# Patient Record
Sex: Male | Born: 2000 | Race: White | Hispanic: No | Marital: Single | Smoking: Current every day smoker
Health system: Southern US, Community
[De-identification: ages and names within clinical notes are randomized; demographics above are authoritative.]

## PROBLEM LIST (undated history)

## (undated) DIAGNOSIS — S069XAA Unspecified intracranial injury with loss of consciousness status unknown, initial encounter: Secondary | ICD-10-CM

## (undated) DIAGNOSIS — Z789 Other specified health status: Secondary | ICD-10-CM

## (undated) DIAGNOSIS — K5900 Constipation, unspecified: Secondary | ICD-10-CM

## (undated) DIAGNOSIS — S069X9A Unspecified intracranial injury with loss of consciousness of unspecified duration, initial encounter: Secondary | ICD-10-CM

## (undated) HISTORY — PX: HERNIA REPAIR: SHX51

## (undated) HISTORY — PX: CIRCUMCISION: SUR203

---
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
## 2022-11-20 ENCOUNTER — Emergency Department (HOSPITAL_BASED_OUTPATIENT_CLINIC_OR_DEPARTMENT_OTHER)
Admission: EM | Admit: 2022-11-20 | Discharge: 2022-11-20 | Disposition: A | Discharge status: Home / Self Care | Payer: Self-pay | Attending: Emergency Medicine | Admitting: Emergency Medicine

## 2022-11-20 ENCOUNTER — Encounter (HOSPITAL_BASED_OUTPATIENT_CLINIC_OR_DEPARTMENT_OTHER): Payer: Self-pay | Admitting: Emergency Medicine

## 2022-11-20 ENCOUNTER — Other Ambulatory Visit (HOSPITAL_BASED_OUTPATIENT_CLINIC_OR_DEPARTMENT_OTHER): Payer: Self-pay

## 2022-11-20 ENCOUNTER — Emergency Department (HOSPITAL_BASED_OUTPATIENT_CLINIC_OR_DEPARTMENT_OTHER): Payer: Self-pay

## 2022-11-20 DIAGNOSIS — R0981 Nasal congestion: Secondary | ICD-10-CM | POA: Insufficient documentation

## 2022-11-20 DIAGNOSIS — Z1152 Encounter for screening for COVID-19: Secondary | ICD-10-CM | POA: Insufficient documentation

## 2022-11-20 DIAGNOSIS — F17210 Nicotine dependence, cigarettes, uncomplicated: Secondary | ICD-10-CM | POA: Insufficient documentation

## 2022-11-20 DIAGNOSIS — B974 Respiratory syncytial virus as the cause of diseases classified elsewhere: Secondary | ICD-10-CM | POA: Insufficient documentation

## 2022-11-20 DIAGNOSIS — B338 Other specified viral diseases: Secondary | ICD-10-CM

## 2022-11-20 LAB — RESP PANEL BY RT-PCR (RSV, FLU A&B, COVID)  RVPGX2
Influenza A by PCR: NEGATIVE
Influenza B by PCR: NEGATIVE
Resp Syncytial Virus by PCR: POSITIVE — AB
SARS Coronavirus 2 by RT PCR: NEGATIVE

## 2022-11-20 MED ORDER — ONDANSETRON 4 MG PO TBDP
4.0000 mg | ORAL_TABLET | Freq: Once | ORAL | Status: AC
  Administered 2022-11-20: 4 mg via ORAL
  Filled 2022-11-20: qty 1

## 2022-11-20 MED ORDER — BENZONATATE 100 MG PO CAPS
100.0000 mg | ORAL_CAPSULE | Freq: Three times a day (TID) | ORAL | 0 refills | Status: DC | PRN
  Filled 2022-11-20: qty 21, 7d supply, fill #0

## 2022-11-20 MED ORDER — ONDANSETRON HCL 4 MG PO TABS
4.0000 mg | ORAL_TABLET | Freq: Four times a day (QID) | ORAL | 0 refills | Status: DC | PRN
  Filled 2022-11-20: qty 12, 3d supply, fill #0

## 2022-11-20 NOTE — ED Notes (Signed)
Pt discharged to home. Discharge instructions have been discussed with patient and/or family members. Pt verbally acknowledges understanding d/c instructions, and endorses comprehension to checkout at registration before leaving. Pt endorses inability to wait to obtain v/s

## 2022-11-20 NOTE — ED Triage Notes (Signed)
Pt is c/o his lungs feel like they are burning when he breathes esp his right one, cough, lots of mucous, and difficulty sleeping   Sxs started about 2 weeks ago

## 2022-11-20 NOTE — ED Notes (Signed)
Per EDP order, pt given fluids and/or food for PO challenge. Pt verbalized understanding to utilize call bell if nausea or emesis occur. 

## 2022-11-20 NOTE — Discharge Instructions (Addendum)
Note the workup today was overall consistent with RSV infection.  As discussed, recommend taking Zofran as needed for nausea/vomiting.  Recommend daily allergy medicine in the form of Zyrtec/Allegra/Claritin.  Also recommend nasal steroid spray in the form of Nasacort/Flonase.  You can take over-the-counter decongestion such as Sudafed.  Also prescribed a cough suppressant called benzonatate to take as needed for persistent cough.  Recommend reevaluation by primary care in 5 to 7 days for reassessment of your symptoms.  Please do not hesitate to return to emergency department for worrisome signs and symptoms we discussed become apparent.

## 2022-11-20 NOTE — ED Provider Notes (Signed)
[facility] EMERGENCY DEPARTMENT Provider Note   CSN: [id number] Arrival date & time: 11/20/22  0654     History  Chief Complaint  Patient presents with   Illness    Edward [name] is a 22 y.o. male.   Illness   22 year old male presents emergency department with complaints of diffuse body aches/joint pain, cough, nasal congestion, sore throat, nausea, vomiting.  Patient reports symptoms intermittent over the past week with worsening over the past 1 to 2 days.  Patient with known sick contact in the form of girlfriend who lives with him exhibiting the same symptoms currently.  Has tried at home DayQuil/NyQuil as well as Mucinex which has helped some.  Denies fever, chills, night sweats, visual disturbance, gait abnormality, weakness/sensory deficits in upper or lower extremities, slurred speech, facial droop, chest pain, shortness of breath, abdominal pain, hematemesis, urinary symptoms, change in bowel habits.  Past medical history significant for TBI  Home Medications Prior to Admission medications   Medication Sig Start Date End Date Taking? Authorizing Provider  benzonatate (TESSALON) 100 MG capsule Take 1 capsule (100 mg total) by mouth 3 (three) times daily as needed for cough. 11/20/22  Yes Dion Saucier A, PA  ondansetron (ZOFRAN) 4 MG tablet Take 1 tablet (4 mg total) by mouth every 6 (six) hours as needed for nausea or vomiting. 11/20/22  Yes Dion Saucier A, PA  diphenhydrAMINE (BENADRYL) 25 MG tablet Take 25 mg as needed by mouth for itching (spider bite).    [provider]  polyethylene glycol powder (GLYCOLAX/MIRALAX) powder Take 255 g once by mouth.  08/31/17   [provider]      Allergies    Patient has no known allergies.    Review of Systems   Review of Systems  All other systems reviewed and are negative.   Physical Exam Updated Vital Signs BP 120/85 (BP Location: Left Arm)   Pulse 84   Temp 98.3 F (36.8 C)   Resp 16    Ht 5\' 11"  (1.803 m)   Wt 75.3 kg   SpO2 99%   BMI 23.15 kg/m  Physical Exam Vitals and nursing note reviewed.  Constitutional:      General: He is not in acute distress.    Appearance: He is well-developed.  HENT:     Head: Normocephalic and atraumatic.     Right Ear: Tympanic membrane normal.     Left Ear: Tympanic membrane normal.     Nose: Congestion and rhinorrhea present.     Mouth/Throat:     Pharynx: Posterior oropharyngeal erythema present.     Comments: Patient with mild posterior pharyngeal erythema.  Uvula midline rise symmetrical with phonation.  Tonsils are 1+ bilaterally with no obvious exudate.  No sublingual or submandibular swelling appreciated. Eyes:     Conjunctiva/sclera: Conjunctivae normal.  Cardiovascular:     Rate and Rhythm: Normal rate and regular rhythm.     Heart sounds: No murmur heard. Pulmonary:     Effort: Pulmonary effort is normal. No respiratory distress.     Breath sounds: Normal breath sounds. No stridor. No wheezing, rhonchi or rales.  Abdominal:     Palpations: Abdomen is soft.     Tenderness: There is no abdominal tenderness.  Musculoskeletal:        General: No swelling.     Cervical back: Neck supple. No rigidity or tenderness.  Skin:    General: Skin is warm and dry.     Capillary Refill: Capillary  refill takes less than 2 seconds.  Neurological:     Mental Status: He is alert.  Psychiatric:        Mood and Affect: Mood normal.     ED Results / Procedures / Treatments   Labs (all labs ordered are listed, but only abnormal results are displayed) Labs Reviewed  RESP PANEL BY RT-PCR (RSV, FLU A&B, COVID)  RVPGX2 - Abnormal; Notable for the following components:      Result Value   Resp Syncytial Virus by PCR POSITIVE (*)    All other components within normal limits    EKG None  Radiology DG Chest 2 View  Result Date: 11/20/2022 CLINICAL DATA:  Chest burning with cough and difficulty sleeping. EXAM: CHEST - 2 VIEW  COMPARISON:  Radiographs 06/20/2013.  CT 06/19/2013. FINDINGS: The heart size and mediastinal contours are normal. The lungs are clear. There is no pleural effusion or pneumothorax. No acute osseous findings are identified. IMPRESSION: No active cardiopulmonary process. Electronically Signed   By: Carey Bullocks M.D.   On: 11/20/2022 08:00    Procedures Procedures    Medications Ordered in ED Medications  ondansetron (ZOFRAN-ODT) disintegrating tablet 4 mg (4 mg Oral Given 11/20/22 0942)    ED Course/ Medical Decision Making/ A&P Clinical Course as of 11/20/22 0958  Thu Nov 20, 2022  0916 Shared decision-making conversation was had regarding patient's intermittent emesis over the past week.  Patient elected for Zofran with trial of food/liquids by mouth and declined laboratory studies and IV fluids at this time. [CR]    Clinical Course User Index [CR] Peter Garter, PA                             Medical Decision Making Amount and/or Complexity of Data Reviewed Radiology: ordered.  Risk Prescription drug management.   This patient presents to the ED for concern of flulike illness, this involves an extensive number of treatment options, and is a complaint that carries with it a high risk of complications and morbidity.  The differential diagnosis includes influenza, COVID, RSV, pneumonia, sepsis, meningitis   Co morbidities that complicate the patient evaluation  See HPI   Additional history obtained:  Additional history obtained from EMR External records from outside source obtained and reviewed including hospital records   Lab Tests:  I Ordered, and personally interpreted labs.  The pertinent results include: Respiratory viral panel positive for RSV   Imaging Studies ordered:  I ordered imaging studies including chest x-ray I independently visualized and interpreted imaging which showed no cardiopulmonary abnormalities I agree with the radiologist  interpretation   Cardiac Monitoring: / EKG:  The patient was maintained on a cardiac monitor.  I personally viewed and interpreted the cardiac monitored which showed an underlying rhythm of: Sinus rhythm   Consultations Obtained:  N/a   Problem List / ED Course / Critical interventions / Medication management  RSV I ordered medication including Zofran   Reevaluation of the patient after these medicines showed that the patient improved I have reviewed the patients home medicines and have made adjustments as needed   Social Determinants of Health:  Chronic cigarette use.  Denies illicit drug use.   Test / Admission - Considered:  RSV Vitals signs  within normal range and stable throughout visit. Laboratory/imaging studies significant for: See above Patient symptoms most likely secondary to RSV.  Patient given Zofran while in the emergency department and tolerated p.o.  without difficulty.  Recommend symptomatic therapy at home with daily antihistamine, cough suppressant as needed, Zofran as needed for nausea/vomiting, nasal steroid spray, Cepacol throat lozenge as well as decongestion.  Recommend follow-up with primary care for reassessment of symptoms.  Treatment plan discussed at length with patient and the patient acknowledge understanding of said plan. Worrisome signs and symptoms were discussed with the patient, and the patient acknowledged understanding to return to the ED if noticed. Patient was stable upon discharge.          Final Clinical Impression(s) / ED Diagnoses Final diagnoses:  RSV (respiratory syncytial virus infection)    Rx / DC Orders ED Discharge Orders          Ordered    ondansetron (ZOFRAN) 4 MG tablet  Every 6 hours PRN        11/20/22 0953    benzonatate (TESSALON) 100 MG capsule  3 times daily PRN        11/20/22 0953              Wilnette Kales, PA 11/20/22 0958    Kemper Durie, DO 11/20/22 1347

## 2022-12-02 ENCOUNTER — Other Ambulatory Visit (HOSPITAL_BASED_OUTPATIENT_CLINIC_OR_DEPARTMENT_OTHER): Payer: Self-pay

## 2023-01-24 ENCOUNTER — Other Ambulatory Visit: Payer: Self-pay

## 2023-01-24 ENCOUNTER — Emergency Department (HOSPITAL_BASED_OUTPATIENT_CLINIC_OR_DEPARTMENT_OTHER): Payer: Medicaid Other

## 2023-01-24 ENCOUNTER — Encounter (HOSPITAL_BASED_OUTPATIENT_CLINIC_OR_DEPARTMENT_OTHER): Payer: Self-pay | Admitting: Emergency Medicine

## 2023-01-24 ENCOUNTER — Emergency Department (HOSPITAL_BASED_OUTPATIENT_CLINIC_OR_DEPARTMENT_OTHER)
Admission: EM | Admit: 2023-01-24 | Discharge: 2023-01-24 | Disposition: A | Discharge status: Home / Self Care | Payer: Medicaid Other | Attending: Emergency Medicine | Admitting: Emergency Medicine

## 2023-01-24 DIAGNOSIS — Y9351 Activity, roller skating (inline) and skateboarding: Secondary | ICD-10-CM | POA: Insufficient documentation

## 2023-01-24 DIAGNOSIS — S0990XA Unspecified injury of head, initial encounter: Secondary | ICD-10-CM | POA: Insufficient documentation

## 2023-01-24 DIAGNOSIS — S3991XA Unspecified injury of abdomen, initial encounter: Secondary | ICD-10-CM | POA: Diagnosis present

## 2023-01-24 DIAGNOSIS — W19XXXA Unspecified fall, initial encounter: Secondary | ICD-10-CM

## 2023-01-24 DIAGNOSIS — S301XXA Contusion of abdominal wall, initial encounter: Secondary | ICD-10-CM | POA: Insufficient documentation

## 2023-01-24 DIAGNOSIS — Y92331 Roller skating rink as the place of occurrence of the external cause: Secondary | ICD-10-CM | POA: Diagnosis not present

## 2023-01-24 MED ORDER — ONDANSETRON 4 MG PO TBDP
4.0000 mg | ORAL_TABLET | Freq: Once | ORAL | Status: AC
  Administered 2023-01-24: 4 mg via ORAL
  Filled 2023-01-24: qty 1

## 2023-01-24 MED ORDER — ACETAMINOPHEN 500 MG PO TABS
1000.0000 mg | ORAL_TABLET | Freq: Once | ORAL | Status: AC
  Administered 2023-01-24: 1000 mg via ORAL
  Filled 2023-01-24: qty 2

## 2023-01-24 NOTE — Discharge Instructions (Signed)
Please make an appointment with your primary care provider I have provided for you or one of your choosing to be reevaluated the next few days.  You may alternate every 6 hours as needed for pain between Tylenol and ibuprofen.  If symptoms worsen please return to ER.

## 2023-01-24 NOTE — ED Triage Notes (Addendum)
Patient c/o fall [place] skate [place] causing headache, neck pain and generalized body pain. Patient has history of TBI.

## 2023-01-24 NOTE — ED Provider Notes (Signed)
[facility] Provider Note   CSN: [id number] Arrival date & time: 01/24/23  1436     History  Chief Complaint  Patient presents with   Lytle Michaels    Edward [name] is a 22 y.o. male history of TBI and basilar skull fracture presented after hitting his head while skateboarding yesterday.  Patient states he was going downhill with his skateboard as he reportedly went on from underneath him and he fell and landed on the left side of his body and hit the back of his head.  Patient states he was nauseous afterwards but did not puke.  Patient states the back of his head continues to hurt and that this morning at 0200 to Tylenol and ibuprofen which has not alleviated symptoms.  Patient says that the whole left side of his body hurts and that when he tries to bear weight in his left hip hurts which prevents him from walking.  Patient denies chest pain, shortness of breath, abdominal pain, changes in sensation, vision changes, back pain, blood thinners or bleeding disorders, photophobia  Home Medications Prior to Admission medications   Medication Sig Start Date End Date Taking? Authorizing Provider  benzonatate (TESSALON) 100 MG capsule Take 1 capsule (100 mg total) by mouth 3 (three) times daily as needed for cough. 11/20/22   Wilnette Kales, PA  diphenhydrAMINE (BENADRYL) 25 MG tablet Take 25 mg as needed by mouth for itching (spider bite).    [provider]  ondansetron (ZOFRAN) 4 MG tablet Take 1 tablet (4 mg total) by mouth every 6 (six) hours as needed for nausea or vomiting. 11/20/22   Dion Saucier A, PA  polyethylene glycol powder (GLYCOLAX/MIRALAX) powder Take 255 g once by mouth.  08/31/17   [provider]      Allergies    Patient has no known allergies.    Review of Systems   Review of Systems Headache See HPI Physical Exam Updated Vital Signs BP 134/78 (BP Location: Left Arm)   Pulse 68   Temp 98.2 F (36.8 C)  (Oral)   Resp 18   Ht 5\' 11"  (1.803 m)   Wt 77.1 kg   SpO2 100%   BMI 23.71 kg/m  Physical Exam Vitals reviewed.  Constitutional:      General: He is not in acute distress. HENT:     Head: Normocephalic and atraumatic.     Comments: Erythema the posterior crown of the head No step-off/crepitus/abnormalities palpated on head    Right Ear: Tympanic membrane, ear canal and external ear normal.     Left Ear: Tympanic membrane, ear canal and external ear normal.     Ears:     Comments: No hemotympanum noted No mastoid ecchymosis    Nose: Nose normal.     Mouth/Throat:     Mouth: Mucous membranes are moist.  Eyes:     Extraocular Movements: Extraocular movements intact.     Conjunctiva/sclera: Conjunctivae normal.     Pupils: Pupils are equal, round, and reactive to light.     Comments: No periorbital ecchymosis noted  Cardiovascular:     Rate and Rhythm: Normal rate and regular rhythm.     Pulses: Normal pulses.     Heart sounds: Normal heart sounds.     Comments: 2+ bilateral radial/dorsalis pedis pulses with regular rate Pulmonary:     Effort: Pulmonary effort is normal. No respiratory distress.     Breath sounds: Normal breath sounds.  Abdominal:  Palpations: Abdomen is soft.     Tenderness: There is no abdominal tenderness. There is no guarding or rebound.  Musculoskeletal:        General: Normal range of motion.     Cervical back: Normal range of motion and neck supple. No tenderness.     Comments: 5 out of 5 bilateral grip/leg extension strength  Skin:    General: Skin is warm and dry.     Capillary Refill: Capillary refill takes less than 2 seconds.     Findings: Bruising (Left flank) present.  Neurological:     General: No focal deficit present.     Mental Status: He is alert and oriented to person, place, and time.     Sensory: Sensation is intact.     Motor: Motor function is intact.     Coordination: Coordination is intact.     Comments: Sensation intact  in all 4 limbs Visual acuity grossly intact Crown nerves III through XII intact Patient unable to bear weight on his left foot due to pain  Psychiatric:        Mood and Affect: Mood normal.     ED Results / Procedures / Treatments   Labs (all labs ordered are listed, but only abnormal results are displayed) Labs Reviewed - No data to display  EKG None  Radiology CT Head Wo Contrast  Result Date: 01/24/2023 CLINICAL DATA:  [place] yesterday while skateboarding, previous history of traumatic brain injury, headache and neck pain EXAM: CT HEAD WITHOUT CONTRAST TECHNIQUE: Contiguous axial images were obtained from the base of the skull through the vertex without intravenous contrast. RADIATION DOSE REDUCTION: This exam was performed according to the departmental dose-optimization program which includes automated exposure control, adjustment of the mA and/or kV according to patient size and/or use of iterative reconstruction technique. COMPARISON:  10/14/2014 FINDINGS: Brain: Chronic areas of encephalomalacia are seen along the bilateral frontal lobes anteriorly and inferiorly, left temporal lobe, and right cerebellar hemisphere, consistent with known history of prior traumatic brain injury. No evidence of acute infarct or hemorrhage. The lateral ventricles and midline structures are unremarkable. No acute extra-axial fluid collections. No mass effect. Vascular: No hyperdense vessel or unexpected calcification. Skull: Normal. Negative for fracture or focal lesion. Sinuses/Orbits: No acute finding. Other: None. IMPRESSION: 1. No acute intracranial process. 2. Stable areas of encephalomalacia within the bilateral frontal, left temporal, and right cerebellar regions consistent with history of traumatic brain injury. Electronically Signed   By: Randa Ngo M.D.   On: 01/24/2023 15:44   DG Pelvis 1-2 Views  Result Date: 01/24/2023 CLINICAL DATA:  LEFT hip pain, fell while skateboarding today, lateral  LEFT pelvic pain worse with weight-bearing EXAM: PELVIS - 1-2 VIEW COMPARISON:  None Available. FINDINGS: Symmetric preserved hip and SI joints. Osseous mineralization normal. No acute fracture, dislocation, or bone destruction. IMPRESSION: Normal exam. Electronically Signed   By: Lavonia Dana M.D.   On: 01/24/2023 15:43    Procedures Procedures    Medications Ordered in ED Medications  acetaminophen (TYLENOL) tablet 1,000 mg (1,000 mg Oral Given 01/24/23 1539)  ondansetron (ZOFRAN-ODT) disintegrating tablet 4 mg (4 mg Oral Given 01/24/23 1539)    ED Course/ Medical Decision Making/ A&P                             Medical Decision Making  Edward [name] 22 y.o. presented today for a fall. Working DDx that I considered at this  time includes, but not limited to, basilar skull fracture, ICH/SAH, epidural/subdural hematoma, pelvis fracture, concussion, intra-abdominal hemorrhage  R/o DDx: Pelvic fracture:  less likely due to history of present illness and physical exam findings SAH/ICH: Timeline and slow onset is not consistent with SAH/ICH, head CT negative Basilar skull fracture: CT negative Epidural/subdural hematoma: CT negative Intra-abdominal hemorrhage: No peritoneal signs noted, no blood thinners, no systemic symptoms  Review of prior external notes: 11/20/2022 ED  Unique Tests and My Interpretation:  CT head without contrast: No acute changes Pelvis x-ray: No acute changes  Discussion with Independent Historian: Girlfriend  Discussion of Management of Tests: None  Risk: Low:  - based on diagnostic testing/clinical impression and treatment plan  Risk Stratification Score: None  Plan: Patient presented for HA. On exam patient was in no distress. Physical exam was remarkable for erythema on the backside of his head along with tenderness in the posterior head and that patient had bruising on his left flank from where he fell and unable to bear weight on his left side due to pain  in his left hip.  Patient will have his head scan with a CT due to his past history of basilar skull fractures and will receive Tylenol for pain management at this time along with Zofran for his nausea.  Patient's pelvis will also be x-rayed as patient was endorsing left hip pain when he stands.  Patient stable at this time.  Patient had negative imaging results and after receiving Tylenol was able to walk to the bathroom under his own power.  On recheck he stated he felt much better after receiving Tylenol.  At this time patient is stable for discharge with outpatient follow-up.  I spoke with the patient about return precautions and that he can alternate every 6 hours as needed for pain with Tylenol and ibuprofen.  Patient was given return precautions. Patient stable for discharge at this time.  Patient verbalized understanding of plan.         Final Clinical Impression(s) / ED Diagnoses Final diagnoses:  Fall, initial encounter    Rx / DC Orders ED Discharge Orders     None         Elvina Sidle 01/24/23 1655    Gareth Morgan, MD 01/26/23 1415

## 2023-05-31 ENCOUNTER — Encounter (HOSPITAL_BASED_OUTPATIENT_CLINIC_OR_DEPARTMENT_OTHER): Payer: Self-pay | Admitting: Emergency Medicine

## 2023-05-31 DIAGNOSIS — R109 Unspecified abdominal pain: Secondary | ICD-10-CM | POA: Diagnosis present

## 2023-05-31 DIAGNOSIS — Z5321 Procedure and treatment not carried out due to patient leaving prior to being seen by health care provider: Secondary | ICD-10-CM | POA: Insufficient documentation

## 2023-05-31 NOTE — ED Triage Notes (Signed)
Pt c/o abd pain; reports had hernia surg 1 wk ago and lifted something heavy tonight; no obvious abnormality

## 2023-06-01 ENCOUNTER — Emergency Department (HOSPITAL_BASED_OUTPATIENT_CLINIC_OR_DEPARTMENT_OTHER)
Admission: EM | Admit: 2023-06-01 | Discharge: 2023-06-01 | Discharge status: Against Medical Advice | Payer: Medicaid Other | Attending: Emergency Medicine | Admitting: Emergency Medicine

## 2023-06-29 DIAGNOSIS — G928 Other toxic encephalopathy: Secondary | ICD-10-CM | POA: Diagnosis not present

## 2023-07-01 ENCOUNTER — Other Ambulatory Visit: Payer: Self-pay

## 2023-07-01 ENCOUNTER — Inpatient Hospital Stay (HOSPITAL_COMMUNITY)
Admission: AD | Admit: 2023-07-01 | Discharge: 2023-07-06 | DRG: 881 | Disposition: A | Discharge status: Home / Self Care | Payer: Medicaid Other | Source: Intra-hospital | Attending: Psychiatry | Admitting: Psychiatry

## 2023-07-01 ENCOUNTER — Encounter (HOSPITAL_COMMUNITY): Payer: Self-pay | Admitting: Psychiatry

## 2023-07-01 DIAGNOSIS — F319 Bipolar disorder, unspecified: Secondary | ICD-10-CM | POA: Diagnosis present

## 2023-07-01 DIAGNOSIS — F909 Attention-deficit hyperactivity disorder, unspecified type: Secondary | ICD-10-CM | POA: Diagnosis present

## 2023-07-01 DIAGNOSIS — R441 Visual hallucinations: Secondary | ICD-10-CM | POA: Diagnosis present

## 2023-07-01 DIAGNOSIS — K011 Impacted teeth: Secondary | ICD-10-CM | POA: Diagnosis present

## 2023-07-01 DIAGNOSIS — F419 Anxiety disorder, unspecified: Secondary | ICD-10-CM | POA: Diagnosis present

## 2023-07-01 DIAGNOSIS — F515 Nightmare disorder: Secondary | ICD-10-CM | POA: Diagnosis present

## 2023-07-01 DIAGNOSIS — Z87820 Personal history of traumatic brain injury: Secondary | ICD-10-CM

## 2023-07-01 DIAGNOSIS — Z8249 Family history of ischemic heart disease and other diseases of the circulatory system: Secondary | ICD-10-CM

## 2023-07-01 DIAGNOSIS — F329 Major depressive disorder, single episode, unspecified: Principal | ICD-10-CM | POA: Diagnosis present

## 2023-07-01 DIAGNOSIS — F17210 Nicotine dependence, cigarettes, uncomplicated: Secondary | ICD-10-CM | POA: Diagnosis present

## 2023-07-01 DIAGNOSIS — F429 Obsessive-compulsive disorder, unspecified: Secondary | ICD-10-CM | POA: Diagnosis present

## 2023-07-01 DIAGNOSIS — Z79899 Other long term (current) drug therapy: Secondary | ICD-10-CM

## 2023-07-01 DIAGNOSIS — R030 Elevated blood-pressure reading, without diagnosis of hypertension: Secondary | ICD-10-CM | POA: Diagnosis not present

## 2023-07-01 DIAGNOSIS — K047 Periapical abscess without sinus: Secondary | ICD-10-CM | POA: Diagnosis present

## 2023-07-01 DIAGNOSIS — G43909 Migraine, unspecified, not intractable, without status migrainosus: Secondary | ICD-10-CM | POA: Diagnosis present

## 2023-07-01 DIAGNOSIS — R440 Auditory hallucinations: Secondary | ICD-10-CM | POA: Diagnosis present

## 2023-07-01 DIAGNOSIS — R45851 Suicidal ideations: Secondary | ICD-10-CM | POA: Diagnosis present

## 2023-07-01 DIAGNOSIS — Z818 Family history of other mental and behavioral disorders: Secondary | ICD-10-CM

## 2023-07-01 DIAGNOSIS — F321 Major depressive disorder, single episode, moderate: Secondary | ICD-10-CM | POA: Diagnosis not present

## 2023-07-01 DIAGNOSIS — F840 Autistic disorder: Secondary | ICD-10-CM | POA: Diagnosis present

## 2023-07-01 MED ORDER — TRAZODONE HCL 50 MG PO TABS: 50.0000 mg | ORAL_TABLET | Freq: Every evening | ORAL | Status: DC | PRN

## 2023-07-01 MED ORDER — AMOXICILLIN 250 MG PO CAPS
500.0000 mg | ORAL_CAPSULE | Freq: Three times a day (TID) | ORAL | Status: AC
  Administered 2023-07-01 – 2023-07-05 (×12): 500 mg via ORAL
  Filled 2023-07-01 (×16): qty 2

## 2023-07-01 MED ORDER — DIPHENHYDRAMINE HCL 25 MG PO CAPS: 50.0000 mg | ORAL_CAPSULE | Freq: Three times a day (TID) | ORAL | Status: DC | PRN

## 2023-07-01 MED ORDER — HALOPERIDOL LACTATE 5 MG/ML IJ SOLN: 5.0000 mg | Freq: Three times a day (TID) | INTRAMUSCULAR | Status: DC | PRN

## 2023-07-01 MED ORDER — HALOPERIDOL 5 MG PO TABS: 5.0000 mg | ORAL_TABLET | Freq: Three times a day (TID) | ORAL | Status: DC | PRN

## 2023-07-01 MED ORDER — ACETAMINOPHEN 325 MG PO TABS: 650.0000 mg | ORAL_TABLET | Freq: Four times a day (QID) | ORAL | Status: DC | PRN

## 2023-07-01 MED ORDER — DIPHENHYDRAMINE HCL 50 MG/ML IJ SOLN: 50.0000 mg | Freq: Three times a day (TID) | INTRAMUSCULAR | Status: DC | PRN

## 2023-07-01 MED ORDER — TEMAZEPAM 15 MG PO CAPS
15.0000 mg | ORAL_CAPSULE | Freq: Every evening | ORAL | Status: DC | PRN
  Administered 2023-07-01: 15 mg via ORAL
  Filled 2023-07-01 (×2): qty 1

## 2023-07-01 MED ORDER — LORAZEPAM 1 MG PO TABS: 2.0000 mg | ORAL_TABLET | Freq: Three times a day (TID) | ORAL | Status: DC | PRN

## 2023-07-01 MED ORDER — ALUM & MAG HYDROXIDE-SIMETH 200-200-20 MG/5ML PO SUSP
30.0000 mL | ORAL | Status: DC | PRN
  Administered 2023-07-03 – 2023-07-05 (×3): 30 mL via ORAL
  Filled 2023-07-01 (×3): qty 30

## 2023-07-01 MED ORDER — PAROXETINE HCL 20 MG PO TABS
40.0000 mg | ORAL_TABLET | Freq: Every day | ORAL | Status: DC
  Administered 2023-07-02 – 2023-07-06 (×5): 40 mg via ORAL
  Filled 2023-07-01 (×9): qty 2

## 2023-07-01 MED ORDER — NICOTINE 14 MG/24HR TD PT24
14.0000 mg | MEDICATED_PATCH | Freq: Every day | TRANSDERMAL | Status: DC
  Administered 2023-07-01 – 2023-07-06 (×6): 14 mg via TRANSDERMAL
  Filled 2023-07-01 (×10): qty 1

## 2023-07-01 MED ORDER — HYDROXYZINE HCL 25 MG PO TABS
25.0000 mg | ORAL_TABLET | Freq: Three times a day (TID) | ORAL | Status: DC | PRN
  Administered 2023-07-02 – 2023-07-03 (×2): 25 mg via ORAL
  Filled 2023-07-01 (×4): qty 1

## 2023-07-01 MED ORDER — NICOTINE POLACRILEX 2 MG MT GUM
2.0000 mg | CHEWING_GUM | Freq: Once | OROMUCOSAL | Status: AC
  Administered 2023-07-01: 2 mg via ORAL
  Filled 2023-07-01 (×2): qty 1

## 2023-07-01 MED ORDER — MAGNESIUM HYDROXIDE 400 MG/5ML PO SUSP
30.0000 mL | Freq: Every day | ORAL | Status: DC | PRN
  Administered 2023-07-05: 30 mL via ORAL
  Filled 2023-07-01: qty 30

## 2023-07-01 MED ORDER — LORAZEPAM 2 MG/ML IJ SOLN: 2.0000 mg | Freq: Three times a day (TID) | INTRAMUSCULAR | Status: DC | PRN

## 2023-07-01 NOTE — [facility] Group Notes (Signed)
Adult Psychoeducational Group Note  Date:  07/01/2023 Time:  8:40 PM  Group Topic/Focus:  Wrap-Up Group:   The focus of this group is to help patients review their daily goal of treatment and discuss progress on daily workbooks.  Participation Level:  Active  Participation Quality:  Appropriate  Affect:  Appropriate  Cognitive:  Appropriate  Insight: Appropriate  Engagement in Group:  Engaged  Modes of Intervention:  Discussion  Additional Comments:  Pt attended group, stated day was 10  because mom visited. Stated tomorrow goal is to work on  leaving  this week.  Joselyn Arrow 07/01/2023, 8:40 PM
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
## 2023-07-01 NOTE — Tx Team (Signed)
Initial Treatment Plan 07/01/2023 4:52 PM Edward [name] [id number]    PATIENT STRESSORS: Marital or family conflict     PATIENT STRENGTHS: Ability for insight  Motivation for treatment/growth  Supportive family/friends    PATIENT IDENTIFIED PROBLEMS: Depression  Anxiety  Family Conflict                 DISCHARGE CRITERIA:  Improved stabilization in mood, thinking, and/or behavior Verbal commitment to aftercare and medication compliance  PRELIMINARY DISCHARGE PLAN: Outpatient therapy  PATIENT/FAMILY INVOLVEMENT: This treatment plan has been presented to and reviewed with the patient, Edward [name].  The patient has been given the opportunity to ask questions and make suggestions.  Roseanne Reno, RN 07/01/2023, 4:52 PM

## 2023-07-01 NOTE — Progress Notes (Signed)
   07/01/23 2148  Psych Admission Type (Psych Patients Only)  Admission Status Involuntary  Psychosocial Assessment  Patient Complaints Anxiety  Eye Contact Fair  Facial Expression Animated  Affect Anxious  Speech Logical/coherent  Interaction Assertive  Motor Activity Other (Comment) (WNL)  Appearance/Hygiene Unremarkable  Behavior Characteristics Cooperative;Appropriate to situation  Mood Anxious;Pleasant  Thought Process  Coherency WDL  Content WDL  Delusions None reported or observed  Perception WDL  Hallucination None reported or observed  Judgment Impaired  Confusion None  Danger to Self  Current suicidal ideation? Denies  Danger to Others  Danger to Others None reported or observed

## 2023-07-02 MED ORDER — RISPERIDONE 1 MG PO TABS
1.0000 mg | ORAL_TABLET | Freq: Every day | ORAL | Status: DC
  Administered 2023-07-02 – 2023-07-05 (×3): 1 mg via ORAL
  Filled 2023-07-02 (×7): qty 1

## 2023-07-02 MED ORDER — QUETIAPINE FUMARATE 50 MG PO TABS: 50.0000 mg | ORAL_TABLET | Freq: Every day | ORAL | Status: DC

## 2023-07-02 MED ORDER — CLONIDINE HCL 0.1 MG PO TABS
0.1000 mg | ORAL_TABLET | Freq: Three times a day (TID) | ORAL | Status: DC | PRN
  Administered 2023-07-02 – 2023-07-05 (×5): 0.1 mg via ORAL
  Filled 2023-07-02 (×5): qty 1

## 2023-07-02 NOTE — H&P (Signed)
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 and anxiety  Initiate Risperdal 1 mg nightly for mood augmentation, sleep induction, and to address potential features of psychosis  Initiate Trazodone 50 mg nightly PRN sleep  Initiate Atarax 25 mg TID PRN anxiety  Will adjust medication regimen as clinically appropriate   The risks/benefits/side-effects/alternatives to this medication were discussed in detail with the patient and time was given for questions. The patient consents to medication trial.   Agitation Protocol:  PO Benadryl 50 mg TID PRN agitation  IM Benadryl 50 mg TID PRN agitation  PO Haldol 5 mg TID PRN agitation  IM Haldol 5 mg TID PRN agitation PO Ativan 2 mg TID PRN agitation IM Ativan 2 mg TID PRN agitation  Metabolic profile and EKG monitoring obtained while on an atypical antipsychotic: ordered TSH, lipid panel, HbA1c, and EKG.  BMI:  23.61              Other PRNS:  Tylenol 650 mg q5hrs PRN mild pain  Alum & Mg hydroxide-simethicone 30 mL suspension q4hrs PRN indigestion  Magnesium hydroxide 30 mL suspension daily PRN mild constipation      3. Medical Issues Being Addressed:   Tobacco Use Disorder  Nicotine patch 14 mg/24 hours ordered  Smoking cessation encouraged  Dental abscess  Initiate amoxicillin 500 mg TID x 4-5 days No evidence of systemic infection  Recommend OP follow up with PCP and dentist  Abnormal vitals: high blood pressure  Initiate clonidine 0.1 mg TID PRN HBP systolic > 130 and diastolic > 90 Continue to monitor and adjust medication regimen as clinically appropriate   4. Group Therapy: Encouraged patient to participate in unit milieu and in scheduled group therapies  Short Term Goals: Ability to identify changes in lifestyle to reduce recurrence of condition will improve, Ability to verbalize feelings will improve, Ability to disclose and discuss suicidal ideas, Ability to demonstrate self-control will  improve, Ability to identify and develop effective coping behaviors will improve, and Ability to identify triggers associated with substance abuse/mental health issues will improve Long Term Goals: Improvement in symptoms so as ready for discharge   5. Discharge Planning:  Social work and case management to assist with discharge planning and identification of hospital follow-up needs prior to discharge Estimated LOS: > 5 days Discharge Concerns: Need to establish a safety plan; Medication compliance and effectiveness; behavioral management and history of legal issues Discharge Goals: Return home with outpatient referrals for mental health follow-up including medication management/psychotherapy. Recommend CBT and possibly DBT.     Disposition: Continue with inpatient level of psychiatric care under IVC admission for further monitoring, safety assessment/planning, and medication management.    I certify that inpatient services furnished can reasonably be expected to improve the patient's condition.     Total Time Spent in Direct Patient Care:  I personally spent 90 minutes on the unit in direct patient care. The direct patient care time included face-to-face time with the patient, reviewing the patient's chart, communicating with other professionals, and coordinating care. Greater than 50% of this time was spent in counseling or coordinating care with the patient regarding goals of hospitalization, psycho-education, and discharge planning needs.    Signed: Dina Rich OMS-4, Psychiatry Acting Intern   8/29/20244:38 PM

## 2023-07-02 NOTE — Progress Notes (Signed)
   07/02/23 1100  Psych Admission Type (Psych Patients Only)  Admission Status Involuntary  Psychosocial Assessment  Patient Complaints Anxiety  Eye Contact Fair  Facial Expression Animated  Affect Anxious  Speech Logical/coherent  Interaction Assertive  Motor Activity Other (Comment) (WNL)  Appearance/Hygiene Unremarkable  Behavior Characteristics Cooperative;Appropriate to situation  Mood Anxious;Pleasant  Thought Process  Coherency WDL  Content WDL  Delusions None reported or observed  Perception WDL  Hallucination None reported or observed  Judgment Impaired  Confusion None  Danger to Self  Current suicidal ideation? Denies  Agreement Not to Harm Self Yes  Description of Agreement verbal  Danger to Others  Danger to Others None reported or observed

## 2023-07-02 NOTE — Group Note (Signed)
Date:  07/02/2023 Time:  10:49 PM  Group Topic/Focus:  Wrap-Up Group:   The focus of this group is to help patients review their daily goal of treatment and discuss progress on daily workbooks.    Participation Level:  Active  Participation Quality:  Appropriate and Sharing  Affect:  Appropriate  Cognitive:  Appropriate  Insight: Appropriate  Engagement in Group:  Engaged  Modes of Intervention:  Discussion and Socialization  Additional Comments:  The group started off with a question; what is your favorite sport/hobby? The patient stated that his favorite hobby is writing and that he has written books. The patient stated that he had a hard day. The patient stated that he learned some things about himself and he's just trying to process and get through it. The patient descried his day as being "negative".   Edward [name] 07/02/2023, 10:49 PM

## 2023-07-02 NOTE — Group Note (Signed)
LCSW Group Therapy Note   Group Date: 07/02/2023 Start Time: 1100 End Time: 1200   Type of Therapy and Topic: Group Therapy: Relationship Check-Ins   Participation Level: Active   Description Group:  In this group patients are encouraged to rate how well or not well they are able to improve their relationships in Beliefs and Values, Communication, Family and Friends, Actuary and Household, and Intimacy. Patients will be able to discuss and identify what is going well within these aspects and what is not. Patients will be able to find appropriate ways, solutions, and skills that will help them within the selected relationships category. Patients will be encouraged to share and reflect on why things within their relationships are not going so well and get feedback from the instructor or their peers.  This group will be solution focused and process-oriented with patients' participation in sharing and listening to their own and peers experience; along with receive support and advice on how to improve or change the circumstance that is known to be challenging in their relationships category (Beliefs and Values, Communication, Family and Friends, Actuary and Household, and Intimacy).   Therapeutic Goals:  Patient will identify their strengths and weakness within their Beliefs and Values, Communication, Family and Friends, Actuary and Household, and Intimacy relationships. Patient will identify reasons why and how they can improve their relationships.  Patient will identify how they can be supportive and honest to themselves and in their relationships.  Patient will be able to gain support and give support to others with similar challenges.   Summary of Patient Progress  Pt attended group and was active.  Therapeutic Modalities:  Solution Focused Therapy Cognitive Behavioral Therapy  Psychodynamic Therapy  Dialectical Behavior Therapy   Edward [name] , LCSWA  Izell Anselmo,  LCSW 07/02/2023  12:24 PM

## 2023-07-02 NOTE — [facility] Group Notes (Signed)
Adult Psychoeducational Group Note  Date:  07/02/2023 Time:  11:06 AM  Group Topic/Focus:  Goals Group:   The focus of this group is to help patients establish daily goals to achieve during treatment and discuss how the patient can incorporate goal setting into their daily lives to aide in recovery.  Participation Level:  Active  Participation Quality:  Appropriate and Attentive  Affect:  Appropriate  Cognitive:  Appropriate  Insight: Good  Engagement in Group:  Engaged  Modes of Intervention:  Discussion and Education  Additional Comments:  Pt participated in group today. Pt stated. Pt his goal is to make as many connections as possible. Pt shared of coming out to his family. Pt identified having a fear of them judging him. Pt was open to suggestions and feedback.  Amarianna Abplanalp 07/02/2023, 11:06 AM
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
## 2023-07-02 NOTE — Plan of Care (Signed)

## 2023-07-03 ENCOUNTER — Encounter (HOSPITAL_COMMUNITY): Payer: Self-pay

## 2023-07-03 NOTE — Plan of Care (Signed)
  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 The reason I did what I did is done cause my Mum accepted me" Patient given reassurance, support and encouragement. Edward [name] is compliant with medications Denies SI/HI/A/VH at present and verbally contracts for safety.

## 2023-07-03 NOTE — Progress Notes (Signed)
   07/02/23 1642 07/02/23 2111  Vital Signs  Temp  --  99.3 F (37.4 C)  Temp Source  --  Oral  Pulse Rate (!) 111 (!) 108  Pulse Rate Source Monitor Monitor  Resp 18 16  BP (!) 133/101 (!) 131/111  BP Method Automatic Automatic   Patient was anxious this evening after V/S checks. Chen is labile. Has been interacting well with Peers and Staff compliant with medications. Reported that " I just remember the medications I overdosed on it was my Mothers Elavil" Patient denies SI/HI/A/VH and verbally contracts for safety. Prn clonidine given for elevated BP and atarax for anxiety. Medications effective Patient in bed sleeping respirations noted. Q 15 minutes safety checks ongoing.

## 2023-07-03 NOTE — Group Note (Signed)
Recreation Therapy Group Note   Group Topic:Team Building  Group Date: 07/03/2023 Start Time: 0930 End Time: 1000 Facilitators: Amarius Toto-McCall, LRT,CTRS Location: [address] Dayroom   Goal Area(s) Addresses:  Patient will effectively work with peer towards shared goal.  Patient will identify skills used to make activity successful.  Patient will identify how skills used during activity can be applied to reach post d/c goals.   Group Description: [employer]. In teams of 5-6, patients were given 11 craft pipe cleaners. Using the materials provided, patients were instructed to compete again the opposing team(s) to build the tallest free-standing structure from floor level. The activity was timed; difficulty increased by Clinical research associate as Production designer, theatre/television/film continued.  Systematically resources were removed with additional directions for example, placing one arm behind their back, working in silence, and shape stipulations. LRT facilitated post-activity discussion reviewing team processes and necessary communication skills involved in completion. Patients were encouraged to reflect how the skills utilized, or not utilized, in this activity can be incorporated to positively impact support systems post discharge.   Clinical Observations/Individualized Feedback: Group did not occur due to previous group going over into group time.     Plan: Continue to engage patient in RT group sessions 2-3x/week.   Raizel Wesolowski-McCall, LRT,CTRS 07/03/2023 12:02 PM

## 2023-07-03 NOTE — [facility] Group Notes (Signed)
Adult Psychoeducational Group Note  Date:  07/03/2023 Time:  11:21 AM  Group Topic/Focus:  Goals Group:   The focus of this group is to help patients establish daily goals to achieve during treatment and discuss how the patient can incorporate goal setting into their daily lives to aide in recovery.  Participation Level:  Active  Participation Quality:  Appropriate and Attentive  Affect:  Appropriate  Cognitive:  Appropriate  Insight: Good  Engagement in Group:  Engaged  Modes of Intervention:  Discussion and Exploration  Additional Comments:  Pt participated in group. Pt stated his goal is to learn how to live a healthier, beneficial life. Facilitator ended the group with several rounds of "Would You Rather" questions  Burnett Sheng 07/03/2023, 11:21 AM

## 2023-07-03 NOTE — BH IP Treatment Plan (Signed)
Interdisciplinary Treatment and Diagnostic Plan Update  07/03/2023 Time of Session: 10:30 AM  Edward [name] MRN: [id number]  Principal Diagnosis: MDD (major depressive disorder)  Secondary Diagnoses: Principal Problem:   MDD (major depressive disorder)   Current Medications:  Current Facility-Administered Medications  Medication Dose Route Frequency Provider Last Rate Last Admin   acetaminophen (TYLENOL) tablet 650 mg  650 mg Oral Q6H PRN Oneta Rack, NP       alum & mag hydroxide-simeth (MAALOX/MYLANTA) 200-200-20 MG/5ML suspension 30 mL  30 mL Oral Q4H PRN Oneta Rack, NP       amoxicillin (AMOXIL) capsule 500 mg  500 mg Oral TID Oneta Rack, NP   500 mg at 07/03/23 1155   cloNIDine (CATAPRES) tablet 0.1 mg  0.1 mg Oral TID PRN Rex Kras, MD   0.1 mg at 07/02/23 2116   diphenhydrAMINE (BENADRYL) capsule 50 mg  50 mg Oral TID PRN Oneta Rack, NP       Or   diphenhydrAMINE (BENADRYL) injection 50 mg  50 mg Intramuscular TID PRN Oneta Rack, NP       haloperidol (HALDOL) tablet 5 mg  5 mg Oral TID PRN Oneta Rack, NP       Or   haloperidol lactate (HALDOL) injection 5 mg  5 mg Intramuscular TID PRN Oneta Rack, NP       hydrOXYzine (ATARAX) tablet 25 mg  25 mg Oral TID PRN Oneta Rack, NP   25 mg at 07/02/23 2117   LORazepam (ATIVAN) tablet 2 mg  2 mg Oral TID PRN Oneta Rack, NP       Or   LORazepam (ATIVAN) injection 2 mg  2 mg Intramuscular TID PRN Oneta Rack, NP       magnesium hydroxide (MILK OF MAGNESIA) suspension 30 mL  30 mL Oral Daily PRN Oneta Rack, NP       nicotine (NICODERM CQ - dosed in mg/24 hours) patch 14 mg  14 mg Transdermal Daily Massengill, Harrold Donath, MD   14 mg at 07/03/23 0804   PARoxetine (PAXIL) tablet 40 mg  40 mg Oral Daily Oneta Rack, NP   40 mg at 07/03/23 5621   risperiDONE (RISPERDAL) tablet 1 mg  1 mg Oral QHS Rex Kras, MD   1 mg at 07/02/23 2116   PTA Medications: Medications Prior to  Admission  Medication Sig Dispense Refill Last Dose   amoxicillin (AMOXIL) 500 MG capsule Take 500 mg by mouth every 8 (eight) hours.      PARoxetine (PAXIL) 40 MG tablet Take 40 mg by mouth daily.       Patient Stressors: Marital or family conflict    Patient Strengths: Ability for insight  Motivation for treatment/growth  Supportive family/friends   Treatment Modalities: Medication Management, Group therapy, Case management,  1 to 1 session with clinician, Psychoeducation, Recreational therapy.   Physician Treatment Plan for Primary Diagnosis: MDD (major depressive disorder) Long Term Goal(s): Improvement in symptoms so as ready for discharge   Short Term Goals: Ability to identify changes in lifestyle to reduce recurrence of condition will improve Ability to verbalize feelings will improve Ability to disclose and discuss suicidal ideas Ability to demonstrate self-control will improve Ability to identify and develop effective coping behaviors will improve Ability to identify triggers associated with substance abuse/mental health issues will improve  Medication Management: Evaluate patient's response, side effects, and tolerance of medication regimen.  Therapeutic Interventions: 1 to 1  sessions, Unit Group sessions and Medication administration.  Evaluation of Outcomes: Not Progressing  Physician Treatment Plan for Secondary Diagnosis: Principal Problem:   MDD (major depressive disorder)  Long Term Goal(s): Improvement in symptoms so as ready for discharge   Short Term Goals: Ability to identify changes in lifestyle to reduce recurrence of condition will improve Ability to verbalize feelings will improve Ability to disclose and discuss suicidal ideas Ability to demonstrate self-control will improve Ability to identify and develop effective coping behaviors will improve Ability to identify triggers associated with substance abuse/mental health issues will improve      Medication Management: Evaluate patient's response, side effects, and tolerance of medication regimen.  Therapeutic Interventions: 1 to 1 sessions, Unit Group sessions and Medication administration.  Evaluation of Outcomes: Not Progressing   RN Treatment Plan for Primary Diagnosis: MDD (major depressive disorder) Long Term Goal(s): Knowledge of disease and therapeutic regimen to maintain health will improve  Short Term Goals: Ability to remain free from injury will improve, Ability to verbalize frustration and anger appropriately will improve, Ability to demonstrate self-control, Ability to participate in decision making will improve, Ability to verbalize feelings will improve, Ability to disclose and discuss suicidal ideas, Ability to identify and develop effective coping behaviors will improve, and Compliance with prescribed medications will improve  Medication Management: RN will administer medications as ordered by provider, will assess and evaluate patient's response and provide education to patient for prescribed medication. RN will report any adverse and/or side effects to prescribing provider.  Therapeutic Interventions: 1 on 1 counseling sessions, Psychoeducation, Medication administration, Evaluate responses to treatment, Monitor vital signs and CBGs as ordered, Perform/monitor CIWA, COWS, AIMS and Fall Risk screenings as ordered, Perform wound care treatments as ordered.  Evaluation of Outcomes: Not Progressing   LCSW Treatment Plan for Primary Diagnosis: MDD (major depressive disorder) Long Term Goal(s): Safe transition to appropriate next level of care at discharge, Engage patient in therapeutic group addressing interpersonal concerns.  Short Term Goals: Engage patient in aftercare planning with referrals and resources, Increase social support, Increase ability to appropriately verbalize feelings, Increase emotional regulation, Facilitate acceptance of mental health diagnosis  and concerns, Facilitate patient progression through stages of change regarding substance use diagnoses and concerns, Identify triggers associated with mental health/substance abuse issues, and Increase skills for wellness and recovery  Therapeutic Interventions: Assess for all discharge needs, 1 to 1 time with Social worker, Explore available resources and support systems, Assess for adequacy in community support network, Educate family and significant other(s) on suicide prevention, Complete Psychosocial Assessment, Interpersonal group therapy.  Evaluation of Outcomes: Not Progressing   Progress in Treatment: Attending groups: Yes. Participating in groups: Yes. Taking medication as prescribed: Yes. Toleration medication: Yes. Family/Significant other contact made: Yes, individual(s) contacted:  [name] Moore/mother [phone] Patient understands diagnosis: Yes. Discussing patient identified problems/goals with staff: Yes. Medical problems stabilized or resolved: Yes. Denies suicidal/homicidal ideation: Yes. Issues/concerns per patient self-inventory: No.   New problem(s) identified: No, Describe:  None reported   New Short Term/Long Term Goal(s): medication stabilization, elimination of SI thoughts, development of comprehensive mental wellness plan.    Patient Goals:  " I have reached my goal already which was finding out what and how to better , mentally, physically, and emotionally "   Discharge Plan or Barriers: Patient recently admitted. CSW will continue to follow and assess for appropriate referrals and possible discharge planning.    Reason for Continuation of Hospitalization: Anxiety Depression Medication stabilization Suicidal ideation  Estimated  Length of Stay: 3-5 days   Last 3 [place] Suicide Severity Risk Score: Flowsheet Row Ad[facility] (Current) [facility] from 06/01/2023 in [facility]  at [facility] from 01/24/2023 in [facility] Emergency Department at [facility]  C-SSRS RISK CATEGORY No Risk No Risk No Risk       Last PHQ 2/9 Scores:     No data to display          Scribe for Treatment Team: Beather Arbour 07/03/2023 12:52 PM

## 2023-07-03 NOTE — Plan of Care (Signed)
?  Problem: Education: ?Goal: Mental status will improve ?Outcome: Progressing ?Goal: Verbalization of understanding the information provided will improve ?Outcome: Progressing ?  ?

## 2023-07-03 NOTE — [facility] Group Notes (Signed)
Adult Psychoeducational Group Note  Date:  07/03/2023 Time:  8:35 PM  Group Topic/Focus:  Wrap-Up Group:   The focus of this group is to help patients review their daily goal of treatment and discuss progress on daily workbooks.  Participation Level:  Active  Participation Quality:  Appropriate  Affect:  Appropriate  Cognitive:  Appropriate  Insight: Appropriate  Engagement in Group:  Engaged  Modes of Intervention:  Discussion  Additional Comments:  Pt. Stated  day was a 10,,stated goal for tomorrow is to get a plan to go home.  Joselyn Arrow 07/03/2023, 8:35 PM
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
## 2023-07-03 NOTE — Progress Notes (Signed)
D: Patient is alert, oriented, pleasant, and cooperative. Denies SI, HI, AVH, and verbally contracts for safety. Patient reports he slept good last night. Patient reports his appetite as good, energy level as high, and concentration as good. Patient rates his depression 0/10, hopelessness 0/10, and anxiety 0/10. Patient denies pain. Patient reports heartburn this evening.    A: Scheduled medications administered per MD order. PRN maalox administered at 1709 and patient reports it was effective. Support provided. Patient educated on safety on the unit and medications. Routine safety checks every 15 minutes. Patient stated understanding to tell nurse about any new physical symptoms. Patient understands to tell staff of any needs.     R: No adverse drug reactions noted. Patient remains safe at this time and will continue to monitor.    07/03/23 1200  Psych Admission Type (Psych Patients Only)  Admission Status Involuntary  Psychosocial Assessment  Patient Complaints None  Eye Contact Fair  Facial Expression Animated  Affect Anxious  Speech Logical/coherent  Interaction Assertive  Motor Activity Other (Comment) (WNL)  Appearance/Hygiene Unremarkable  Behavior Characteristics Cooperative;Appropriate to situation  Mood Pleasant  Thought Process  Coherency WDL  Content WDL  Delusions None reported or observed  Perception WDL  Hallucination None reported or observed  Judgment Impaired  Confusion None  Danger to Self  Current suicidal ideation? Denies  Agreement Not to Harm Self Yes  Description of Agreement verbal  Danger to Others  Danger to Others None reported or observed

## 2023-07-03 NOTE — [facility] Suicide Risk Assessment (Signed)
[facility] INPATIENT:  Family/Significant Other Suicide Prevention Education  Suicide Prevention Education:  Education Completed; Edward [name]/mother [phone], has been identified by the patient as the family member/significant other with whom the patient will be residing, and identified as the person(s) who will aid the patient in the event of a mental health crisis (suicidal ideations/suicide attempt).  With written consent from the patient, the family member/significant other has been provided the following suicide prevention education, prior to the and/or following the discharge of the patient.  The suicide prevention education provided includes the following: Suicide risk factors Suicide prevention and interventions National Suicide Hotline telephone number [facility] assessment telephone number [facility] Emergency Assistance 911 [facility] and/or Residential Mobile Crisis Unit telephone number  Request made of family/significant other to: Remove weapons (e.g., guns, rifles, knives), all items previously/currently identified as safety concern.   Remove drugs/medications (over-the-counter, prescriptions, illicit drugs), all items previously/currently identified as a safety concern.  The family member/significant other verbalizes understanding of the suicide prevention education information provided.  The family member/significant other agrees to remove the items of safety concern listed above.  Edward [name] 07/03/2023, 12:22 PM

## 2023-07-04 DIAGNOSIS — F329 Major depressive disorder, single episode, unspecified: Secondary | ICD-10-CM | POA: Diagnosis not present

## 2023-07-04 NOTE — Plan of Care (Signed)
  Problem: Education: Goal: Knowledge of [facility] Education information/materials will improve Outcome: Progressing Goal: Emotional status will improve Outcome: Progressing Goal: Mental status will improve Outcome: Progressing Goal: Verbalization of understanding the information provided will improve Outcome: Progressing   Problem: Activity: Goal: Interest or engagement in activities will improve Outcome: Progressing Goal: Sleeping patterns will improve Outcome: Progressing   Problem: Coping: Goal: Ability to verbalize frustrations and anger appropriately will improve Outcome: Progressing Goal: Ability to demonstrate self-control will improve Outcome: Progressing   Problem: Health Behavior/Discharge Planning: Goal: Identification of resources available to assist in meeting health care needs will improve Outcome: Progressing Goal: Compliance with treatment plan for underlying cause of condition will improve Outcome: Progressing

## 2023-07-04 NOTE — [facility] Group Notes (Signed)
[facility] Group Notes:  (Nursing/MHT/Case Management/Adjunct)  Date:  07/04/2023  Time:  8:37 PM  Type of Therapy:  Wrap Up Group  Participation Level:  Active  Participation Quality:  Appropriate  Affect:  Appropriate  Cognitive:  Appropriate  Insight:  Improving  Engagement in Group:  Improving  Modes of Intervention:  Discussion  Summary of Progress/Problems:Pt had a grerat day of 10/10 and was happy seeing his mom  Levander Campion 07/04/2023, 8:37 PM

## 2023-07-04 NOTE — Progress Notes (Signed)
   07/04/23 0900  Psych Admission Type (Psych Patients Only)  Admission Status Involuntary  Psychosocial Assessment  Patient Complaints Worrying  Eye Contact Fair  Facial Expression Animated  Affect Preoccupied;Labile  Speech Logical/coherent  Interaction Assertive  Motor Activity Other (Comment) (wnl)  Appearance/Hygiene Unremarkable  Behavior Characteristics Cooperative  Mood Pleasant  Thought Process  Coherency WDL  Content WDL  Delusions None reported or observed  Perception WDL  Hallucination None reported or observed  Judgment Limited  Confusion None  Danger to Self  Current suicidal ideation? Denies  Agreement Not to Harm Self Yes  Description of Agreement verbal  Danger to Others  Danger to Others None reported or observed
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
## 2023-07-05 DIAGNOSIS — F329 Major depressive disorder, single episode, unspecified: Secondary | ICD-10-CM

## 2023-07-05 LAB — LIPID PANEL
Cholesterol: 230 mg/dL — ABNORMAL HIGH (ref 0–200)
HDL: 43 mg/dL (ref >40)
LDL Cholesterol: 144 mg/dL — ABNORMAL HIGH (ref 0–99)
Total CHOL/HDL Ratio: 5.3 ratio
Triglycerides: 216 mg/dL — ABNORMAL HIGH (ref <150)
VLDL: 43 mg/dL — ABNORMAL HIGH (ref 0–40)

## 2023-07-05 LAB — HEMOGLOBIN A1C
Hgb A1c MFr Bld: 5.4 % (ref 4.8–5.6)
Mean Plasma Glucose: 108.28 mg/dL

## 2023-07-05 LAB — TSH: TSH: 1.796 u[IU]/mL (ref 0.350–4.500)

## 2023-07-05 MED ORDER — PRAZOSIN HCL 1 MG PO CAPS
1.0000 mg | ORAL_CAPSULE | Freq: Every day | ORAL | Status: DC
  Administered 2023-07-05: 1 mg via ORAL
  Filled 2023-07-05 (×3): qty 1

## 2023-07-05 MED ORDER — PROPRANOLOL HCL 10 MG PO TABS: 10.0000 mg | ORAL_TABLET | Freq: Two times a day (BID) | ORAL | Status: DC

## 2023-07-05 MED ORDER — PANTOPRAZOLE SODIUM 40 MG PO TBEC
40.0000 mg | DELAYED_RELEASE_TABLET | Freq: Every day | ORAL | Status: DC
  Administered 2023-07-05 – 2023-07-06 (×2): 40 mg via ORAL
  Filled 2023-07-05 (×5): qty 1

## 2023-07-05 NOTE — Group Note (Signed)
LCSW Group Therapy Note   Group Date: 07/05/2023 Start Time: 1000 End Time: 1100   . Type of Therapy and Topic:  Group Therapy: Embracing Change   Participation Level:  Active   Description of Group:   In this group, patients shared and discussed the importance change.  The group discussed how change plays a vial role in life and the decision that are made. A group discussion was facilitated in group to encourage participants to challenge negative thought with positives in their lives.  Therapeutic Goals: Patients will identify how the word "Change" resonates with them  Patients will discuss how they handle change Patients will explore negative and positive emotions related to change. Patients will verbalize what change look like for them/   Summary of Patient Progress:  The patient shared that he welcomes change.   Patient's reaction to the group was inspirational. Patient shared personal story of how embracing change even when fearful gave him insight and he learned a lot about himself. .  Therapeutic Modalities:   Solution-Focused Therapy Activity  Steffanie Dunn , LCSWA  07/05/2023  1:32 PM

## 2023-07-05 NOTE — Progress Notes (Signed)
   07/04/23 1656 07/04/23 2043  Vital Signs  Temp  --  98.7 F (37.1 C)  Temp Source  --  Oral  Pulse Rate (!) 101 99  Pulse Rate Source  --  Monitor  Resp  --  18  BP (!) 140/97 (!) 152/104  BP Location Left Arm Right Arm  BP Method Automatic Automatic  Patient Position (if appropriate) Sitting Sitting   Patient endorse anxiety and sleep disturbance refused scheduled Risperdal and HS refused Clonidine for elevated bp stated "My Doctor said I was starting new medication tonight. I am not taking Risperdal or any other medications I have taken before because its not working for me. Patient was emotional and walked away from med room" Patient was reassured and encouraged to take medication later agreed to take PRN Clonidine for elevated BP. Q 15 minutes safety checks ongoing. Patient remains safe.

## 2023-07-05 NOTE — Plan of Care (Signed)

## 2023-07-05 NOTE — [facility] Group Notes (Signed)
Adult Psychoeducational Group Note  Date:  07/05/2023 Time:  9:54 AM  Group Topic/Focus:  Goals Group:   The focus of this group is to help patients establish daily goals to achieve during treatment and discuss how the patient can incorporate goal setting into their daily lives to aide in recovery. Orientation:   The focus of this group is to educate the patient on the purpose and policies of crisis stabilization and provide a format to answer questions about their admission.  The group details unit policies and expectations of patients while admitted.  Participation Level:  Active  Participation Quality:  Attentive  Affect:  Appropriate  Cognitive:  Alert  Insight: Appropriate  Engagement in Group:  Engaged  Modes of Intervention:  Discussion  Additional Comments:  Patient attended and participated in the goals group.  Jearl Klinefelter 07/05/2023, 9:54 AM

## 2023-07-05 NOTE — Plan of Care (Signed)
  Problem: Physical Regulation: Goal: Ability to maintain clinical measurements within normal limits will improve Outcome: Progressing   Problem: Safety: Goal: Periods of time without injury will increase Outcome: Progressing   

## 2023-07-05 NOTE — [facility] Group Notes (Signed)
[facility] Group Notes:  (Nursing/MHT/Case Management/Adjunct)  Date:  07/05/2023  Time:  2000  Type of Therapy:   Wrap up group  Participation Level:  Active  Participation Quality:  Appropriate, Attentive, Sharing, and Supportive  Affect:  Appropriate  Cognitive:  Alert  Insight:  Improving  Engagement in Group:  Engaged  Modes of Intervention:  Clarification, Education, and Socialization  Summary of Progress/Problems: Positive thinking and self-care were discussed.   Marcille Buffy 07/05/2023, 9:28 PM

## 2023-07-05 NOTE — Progress Notes (Signed)
   07/05/23 0900  Psych Admission Type (Psych Patients Only)  Admission Status Involuntary  Psychosocial Assessment  Patient Complaints None  Eye Contact Fair  Facial Expression Animated  Affect Labile;Preoccupied  Speech Logical/coherent  Interaction Assertive  Motor Activity Other (Comment) (wnl)  Appearance/Hygiene Unremarkable  Behavior Characteristics Cooperative  Mood Labile  Thought Process  Coherency WDL  Content WDL  Delusions None reported or observed  Perception WDL  Hallucination None reported or observed  Judgment Impaired  Confusion None  Danger to Self  Current suicidal ideation? Denies  Agreement Not to Harm Self Yes  Description of Agreement verbal  Danger to Others  Danger to Others None reported or observed

## 2023-07-05 NOTE — Progress Notes (Signed)
   07/05/23 2225  Psych Admission Type (Psych Patients Only)  Admission Status Involuntary  Psychosocial Assessment  Patient Complaints None  Eye Contact Fair  Facial Expression Animated  Affect Appropriate to circumstance  Speech Logical/coherent  Interaction Assertive  Motor Activity Other (Comment) (wdl)  Appearance/Hygiene Unremarkable  Behavior Characteristics Cooperative  Mood Anxious  Thought Process  Coherency WDL  Content WDL  Delusions None reported or observed  Perception WDL  Hallucination None reported or observed  Judgment Impaired  Confusion None  Danger to Self  Current suicidal ideation? Denies  Agreement Not to Harm Self No  Danger to Others  Danger to Others None reported or observed   D: Patient in dayroom reports he had a good day and is able to engage therapeutically. A: Medications administered as prescribed. Support and encouragement provided as needed.  R: Patient remains safe on the unit. Pt attended evening wrap up group.

## 2023-07-05 NOTE — Progress Notes (Signed)
[facility] MD Progress Note  07/05/2023 1:32 PM Edward [name]  MRN:  [id number] Subjective:   Edward [name] is a 22 yr old male who presented on 8/24 to [facility] ED after a Suicide Attempt (OD on 20-30 Amitriptyline 25 mg tablets), he was admitted under IVC to [facility] on 8/29.  PPHx is significant for Depression, ASD, ADHD, and reported Bipolar Disorder, and no Prior Suicide Attempts or Psychiatric Hospitalization.  PMHx is significant for TBI.    Case was discussed in the multidisciplinary team. MAR was reviewed and patient was mostly compliant with medications he refused his Risperdal last night.  He received PRN Clonidine twice (last night and this morning).   Psychiatric Team made the following recommendations yesterday: -Continue Paxil 40 mg daily for depression -Continue Risperdal 1 mg QHS for mood stability    On interview today patient reports he slept good last night but had significant nightmares.  He reports his appetite is doing good.  He reports no SI, HI, or AVH.  He reports no Paranoia or Ideas of Reference.  He reports no issues with his medications.  Initially discussed starting propranolol for his elevated blood pressure and heart rate.  However, after he reported significant issues with nightmares discussed prazosin with him.  Discussed potential risks and side effects and he was agreeable to the trial.  Discussed that since we would be starting prazosin we would cancel the propranolol as starting to blood pressure medications at the same time could lower his blood pressure too much.  He reports he has been having heartburn since admission.  Encouraged him to ask for as needed medication and that we would also start Protonix and he was agreeable.  Discussed with him that number for his mother and the chart was incorrect and he reports that she currently has his phone and so to call his number which was then provided.  He reports no other concerns at present.   Called patient's mother,  Edward [name], [phone].  She reports that she does support her son and will go to therapy with him and do whatever is required.  She confirms that he will be staying with her once he is discharged from the hospital.  She reports no safety concerns present.    Principal Problem: MDD (major depressive disorder) Diagnosis: Principal Problem:   MDD (major depressive disorder)  Total Time spent with patient:  I personally spent 35 minutes on the unit in direct patient care. The direct patient care time included face-to-face time with the patient, reviewing the patient's chart, communicating with other professionals, and coordinating care. Greater than 50% of this time was spent in counseling or coordinating care with the patient regarding goals of hospitalization, psycho-education, and discharge planning needs.   Past Psychiatric History: Depression, ASD, ADHD, and reported Bipolar Disorder, and no Prior Suicide Attempts or Psychiatric Hospitalization.  PMHx is significant for TBI.   Past Medical History:  Past Medical History:  Diagnosis Date   Constipation    Medical history non-contributory    Traumatic brain injury [facility])     Past Surgical History:  Procedure Laterality Date   CIRCUMCISION     HERNIA REPAIR     Family History:  Family History  Problem Relation Age of Onset   Cancer Mother 23       Breast   Heart disease Maternal Grandfather    Hypertension Maternal Grandfather    Diabetes Father    Family Psychiatric  History:  Father- Bipolar Disorder, Anxiety Mother-  Schizophrenia Brother- Bipolar Disorder, Schizophrenia Paternal and Maternal Side- Multiple members polysubstance abuse  Social History:  Social History   Substance and Sexual Activity  Alcohol Use Yes     Social History   Substance and Sexual Activity  Drug Use Yes   Types: Marijuana    Social History   Socioeconomic History   Marital status: Single    Spouse name: Not on file   Number of  children: Not on file   Years of education: Not on file   Highest education level: Not on file  Occupational History   Not on file  Tobacco Use   Smoking status: Every Day    Current packs/day: 0.50    Types: Cigarettes   Smokeless tobacco: Former  Building services engineer status: Some Days   Substances: Nicotine  Substance and Sexual Activity   Alcohol use: Yes   Drug use: Yes    Types: Marijuana   Sexual activity: Never  Other Topics Concern   Not on file  Social History Narrative   Not on file   Social Determinants of Health   Financial Resource Strain: Not on file  Food Insecurity: No Food Insecurity (07/01/2023)   Hunger Vital Sign    Worried About Running Out of Food in the Last Year: Never true    Ran Out of Food in the Last Year: Never true  Transportation Needs: No Transportation Needs (07/01/2023)   PRAPARE - Administrator, Civil Service (Medical): No    Lack of Transportation (Non-Medical): No  Physical Activity: Not on file  Stress: Not on file  Social Connections: Not on file   Additional Social History:                         Sleep: Good, but reports nightmares  Appetite:  Good  Current Medications: Current Facility-Administered Medications  Medication Dose Route Frequency Provider Last Rate Last Admin   acetaminophen (TYLENOL) tablet 650 mg  650 mg Oral Q6H PRN Oneta Rack, NP       alum & mag hydroxide-simeth (MAALOX/MYLANTA) 200-200-20 MG/5ML suspension 30 mL  30 mL Oral Q4H PRN Oneta Rack, NP   30 mL at 07/05/23 1059   cloNIDine (CATAPRES) tablet 0.1 mg  0.1 mg Oral TID PRN Rex Kras, MD   0.1 mg at 07/05/23 0805   diphenhydrAMINE (BENADRYL) capsule 50 mg  50 mg Oral TID PRN Oneta Rack, NP       Or   diphenhydrAMINE (BENADRYL) injection 50 mg  50 mg Intramuscular TID PRN Oneta Rack, NP       haloperidol (HALDOL) tablet 5 mg  5 mg Oral TID PRN Oneta Rack, NP       Or   haloperidol lactate (HALDOL)  injection 5 mg  5 mg Intramuscular TID PRN Oneta Rack, NP       hydrOXYzine (ATARAX) tablet 25 mg  25 mg Oral TID PRN Oneta Rack, NP   25 mg at 07/03/23 2122   LORazepam (ATIVAN) tablet 2 mg  2 mg Oral TID PRN Oneta Rack, NP       Or   LORazepam (ATIVAN) injection 2 mg  2 mg Intramuscular TID PRN Oneta Rack, NP       magnesium hydroxide (MILK OF MAGNESIA) suspension 30 mL  30 mL Oral Daily PRN Oneta Rack, NP   30 mL at 07/05/23 1059  nicotine (NICODERM CQ - dosed in mg/24 hours) patch 14 mg  14 mg Transdermal Daily Massengill, Nathan, MD   14 mg at 07/05/23 0758   pantoprazole (PROTONIX) EC tablet 40 mg  40 mg Oral Daily Lauro Franklin, MD   40 mg at 07/05/23 0914   PARoxetine (PAXIL) tablet 40 mg  40 mg Oral Daily Oneta Rack, NP   40 mg at 07/05/23 0758   prazosin (MINIPRESS) capsule 1 mg  1 mg Oral QHS Lauro Franklin, MD       risperiDONE (RISPERDAL) tablet 1 mg  1 mg Oral QHS Rex Kras, MD   1 mg at 07/03/23 2122    Lab Results: No results found for this or any previous visit (from the past 48 hour(s)).  Blood Alcohol level:  Lab Results  Component Value Date   ETH <11 06/19/2013    Metabolic Disorder Labs: No results found for: "HGBA1C", "MPG" No results found for: "PROLACTIN" No results found for: "CHOL", "TRIG", "HDL", "CHOLHDL", "VLDL", "LDLCALC"  Physical Findings: AIMS:  , ,  ,  ,    CIWA:    COWS:     Musculoskeletal: Strength & Muscle Tone: within normal limits Gait & Station: normal Patient leans: N/A  Psychiatric Specialty Exam:  Presentation  General Appearance: Appropriate for Environment; Casual  Eye Contact:Fair  Speech:Clear and Coherent; Normal Rate  Speech Volume:Normal  Handedness:No data recorded  Mood and Affect  Mood:Anxious; Irritable  Affect:Labile   Thought Process  Thought Processes:Goal Directed; Linear  Descriptions of Associations:Intact  Orientation:Full (Time, Place and  Person)  Thought Content:Logical; WDL  History of Schizophrenia/Schizoaffective disorder:No data recorded Duration of Psychotic Symptoms:No data recorded Hallucinations:Hallucinations: None  Ideas of Reference:None  Suicidal Thoughts:Suicidal Thoughts: No  Homicidal Thoughts:Homicidal Thoughts: No   Sensorium  Memory:Immediate Fair; Recent Fair  Judgment:Intact  Insight:Present   Executive Functions  Concentration:Good  Attention Span:Good  Recall:Good  Fund of Knowledge:Good  Language:Good   Psychomotor Activity  Psychomotor Activity:Psychomotor Activity: Normal   Assets  Assets:Communication Skills; Physical Health; Resilience; Housing   Sleep  Sleep:Sleep: Good Number of Hours of Sleep: 8.25    Physical Exam: Physical Exam Vitals and nursing note reviewed.  Constitutional:      General: He is not in acute distress.    Appearance: Normal appearance. He is normal weight. He is not ill-appearing or toxic-appearing.  HENT:     Head: Normocephalic and atraumatic.  Pulmonary:     Effort: Pulmonary effort is normal.  Musculoskeletal:        General: Normal range of motion.  Neurological:     General: No focal deficit present.     Mental Status: He is alert.    Review of Systems  Respiratory:  Negative for cough and shortness of breath.   Cardiovascular:  Negative for chest pain.  Gastrointestinal:  Positive for heartburn. Negative for abdominal pain, constipation, diarrhea, nausea and vomiting.  Neurological:  Negative for dizziness, weakness and headaches.  Psychiatric/Behavioral:  Negative for depression, hallucinations and suicidal ideas. The patient is not nervous/anxious.    Blood pressure (!) 130/103, pulse (!) 113, temperature 98.3 F (36.8 C), temperature source Oral, resp. rate 18, height 5\' 11"  (1.803 m), weight 76.8 kg, SpO2 96%. Body mass index is 23.61 kg/m.   Treatment Plan Summary: Daily contact with patient to assess and  evaluate symptoms and progress in treatment and Medication management  Damire Wojtaszek is a 22 yr old male who presented on 8/24 to [facility]  ED after a Suicide Attempt (OD on 20-30 Amitriptyline 25 mg tablets), he was admitted under IVC to [facility] on 8/29.  PPHx is significant for Depression, ASD, ADHD, and reported Bipolar Disorder, and no Prior Suicide Attempts or Psychiatric Hospitalization.  PMHx is significant for TBI.    Berel continues to have occasional behavioral outbursts due to personality traits but overall does appear to be improving.  He is reporting significant issues with nightmares and so we will start prazosin.  If blood pressure and heart rate continue to be elevated could consider starting propranolol.  As he is having acid reflux we will start Protonix.  We will not make any other changes to his medication at this time.  We will continue to monitor.   MDD:  (R/O Substance Induced Mood/Thought Disorder and Bipolar Disorder) -Continue Paxil 40 mg daily for depression -Continue Risperdal 1 mg QHS for mood stability -Continue Agitation Protocol: Haldol/Ativan/Benadryl   Nicotine Dependence: -Continue Nicotine Patch 14 mg daily   Dental Abscess: -Continue Amoxicillin 500 mg TID for 4 days (Day 3 of 4)   -Continue Clonidine 0.1 mg TID PRN Systolic > 130 or Diastolic > 90 -Continue PRN's: Tylenol, Maalox, Atarax, Milk of Magnesia, Trazodone    Lauro Franklin, MD 07/05/2023, 1:32 PM

## 2023-07-05 NOTE — [facility] Group Notes (Signed)
[facility] Group Notes:  (Nursing)  Date:  07/05/2023  Time:  1345  Type of Therapy:  Psychoeducational Skills  Participation Level:  Active  Participation Quality:  Appropriate and Attentive  Affect:  Appropriate  Cognitive:  Alert and Appropriate  Insight:  Appropriate and Good  Engagement in Group:  Engaged and Supportive  Modes of Intervention:  Activity, Discussion, Exploration, Rapport Building, Socialization, and Support  Summary of Progress/Problems: Life Collage  The focus of this group is helping patients create a visual composition (collage) representing their thoughts emotions and experiences. Selecting and arranging these elements allow individuals to tap into their subconscious and express their emotions in a non verbal and symbolic way.   Shela Nevin 07/05/2023, 4:42 PM

## 2023-07-06 DIAGNOSIS — F321 Major depressive disorder, single episode, moderate: Secondary | ICD-10-CM | POA: Diagnosis not present

## 2023-07-06 MED ORDER — PRAZOSIN HCL 1 MG PO CAPS: 1.0000 mg | ORAL_CAPSULE | Freq: Every day | ORAL | 0 refills | Status: AC

## 2023-07-06 MED ORDER — RISPERIDONE 1 MG PO TABS: 1.0000 mg | ORAL_TABLET | Freq: Every day | ORAL | 0 refills | Status: AC

## 2023-07-06 MED ORDER — NICOTINE 14 MG/24HR TD PT24: 14.0000 mg | MEDICATED_PATCH | Freq: Every day | TRANSDERMAL | 0 refills | Status: AC

## 2023-07-06 MED ORDER — PAROXETINE HCL 40 MG PO TABS: 40.0000 mg | ORAL_TABLET | Freq: Every day | ORAL | 0 refills | Status: AC

## 2023-07-06 NOTE — Plan of Care (Signed)
  Problem: Education: Goal: Emotional status will improve Outcome: Progressing   Problem: Education: Goal: Mental status will improve Outcome: Progressing   Problem: Education: Goal: Verbalization of understanding the information provided will improve Outcome: Progressing   

## 2023-07-06 NOTE — Progress Notes (Signed)
Patient verbalizes readiness for discharge. All patient belongings returned to patient. Discharge instructions read and discussed with patient (appointments, medications, resources). Patient expressed gratitude for care provided. Patient discharged to lobby at 1100 where mom was waiting.

## 2023-07-06 NOTE — Progress Notes (Signed)
  [facility] Adult Case Management Discharge Plan :  Will you be returning to the same living situation after discharge:  Yes,  Edward [name]/mother [phone]  At discharge, do you have transportation home?: Yes Edward [name]/mother [phone]  Do you have the ability to pay for your medications: Yes,  Insured Medicaid  Release of information consent forms completed and in the chart;  Patient's signature needed at discharge.  Patient to Follow up at:  Follow-up Information     [facility] [employer]. Go to.   Why: Please go to this provider for an assessment, to obtain group therapy and medication management services.  Open 24/7. Contact information: [address] [place] [address] [phone]                 Next level of care provider has access to [facility] Link:no  Safety Planning and Suicide Prevention discussed: Edward [name]/mother [phone]      Has patient been referred to the Quitline?: Patient refused referral for treatment  Patient has been referred for addiction treatment: Yes, referral information given but appointment not made Edward [name] (list facility). Patient to continue working towards treatment goals after discharge. Patient no longer meets criteria for inpatient criteria per attending physician. Continue taking medications as prescribed, nursing to provide instructions at discharge. Follow up with all scheduled appointments.   Edward Suppa S Sufyaan Palma, LCSW 07/06/2023, 9:52 AM
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
## 2023-07-06 NOTE — [facility] Suicide Risk Assessment (Signed)
[facility] Discharge Suicide Risk Assessment   Principal Problem: MDD (major depressive disorder) Discharge Diagnoses: Principal Problem:   MDD (major depressive disorder)   Total Time spent with patient: 30 minutes  Musculoskeletal: Strength & Muscle Tone: within normal limits Gait & Station: normal Patient leans: N/A  Psychiatric Specialty Exam  Presentation  General Appearance:  Appropriate for Environment; Casual  Eye Contact: Fair  Speech: Clear and Coherent; Normal Rate  Speech Volume: Normal  Handedness:No data recorded  Mood and Affect  Mood: Anxious; Irritable  Duration of Depression Symptoms: No data recorded Affect: Labile   Thought Process  Thought Processes: Goal Directed; Linear  Descriptions of Associations:Intact  Orientation:Full (Time, Place and Person)  Thought Content:Logical; WDL  History of Schizophrenia/Schizoaffective disorder:No data recorded Duration of Psychotic Symptoms:No data recorded Hallucinations:No data recorded Ideas of Reference:None  Suicidal Thoughts:No data recorded Homicidal Thoughts:No data recorded  Sensorium  Memory: Immediate Fair; Recent Fair  Judgment: Intact  Insight: Present   Executive Functions  Concentration: Good  Attention Span: Good  Recall: Good  Fund of Knowledge: Good  Language: Good   Psychomotor Activity  Psychomotor Activity:No data recorded  Assets  Assets: Communication Skills; Physical Health; Resilience; Housing   Sleep  Sleep:No data recorded  Physical Exam: Physical Exam ROS Blood pressure 119/82, pulse (!) 103, temperature 98.2 F (36.8 C), temperature source Oral, resp. rate 16, height 5\' 11"  (1.803 m), weight 76.8 kg, SpO2 100%. Body mass index is 23.61 kg/m.  Mental Status Per Nursing Assessment::   On Admission:  Self-harm behaviors  Demographic Factors:  Male, Caucasian, and Gay, lesbian, or bisexual orientation  Loss  Factors: NA  Historical Factors: Impulsivity  Risk Reduction Factors:   Living with another person, especially a relative and NA  Continued Clinical Symptoms:  Depression:   Impulsivity  Cognitive Features That Contribute To Risk:  None    Suicide Risk:  Mild:  Suicidal ideation of limited frequency, intensity, duration, and specificity.  There are no identifiable plans, no associated intent, mild dysphoria and related symptoms, good self-control (both objective and subjective assessment), few other risk factors, and identifiable protective factors, including available and accessible social support.   Follow-up Information     Inc, [employer]. Go to.   Why: Please go to this provider for an assessment, to obtain group therapy and medication management services.  Open 24/7. Contact information: [address] [place] [address] [phone]                 Plan Of Care/Follow-up recommendations:  Activity:  As tolerated  Rex Kras, MD 07/06/2023, 8:58 AM

## 2023-07-27 ENCOUNTER — Encounter: Payer: Self-pay | Admitting: Internal Medicine
